# Patient Record
Sex: Male | Born: 1970 | Race: White | Hispanic: No | Marital: Married | Smoking: Current some day smoker
Health system: Southern US, Community
[De-identification: ages and names within clinical notes are randomized; demographics above are authoritative.]

## PROBLEM LIST (undated history)

## (undated) DIAGNOSIS — I10 Essential (primary) hypertension: Secondary | ICD-10-CM

## (undated) HISTORY — DX: Essential (primary) hypertension: I10

---

## 2009-06-13 ENCOUNTER — Encounter: Admission: RE | Admit: 2009-06-13 | Discharge: 2009-06-13 | Payer: Self-pay | Admitting: Occupational Medicine

## 2009-11-15 ENCOUNTER — Encounter: Admission: RE | Admit: 2009-11-15 | Discharge: 2009-11-15 | Payer: Self-pay | Admitting: Occupational Medicine

## 2011-02-07 IMAGING — CR DG CERVICAL SPINE COMPLETE 4+V
6 series · 6 of 6 positions shown · non-contrast
Comparison: None

CLINICAL DATA: Neck and right arm pain

CERVICAL SPINE - COMPLETE 4+ VIEW

[view not recorded (1 of 6)]
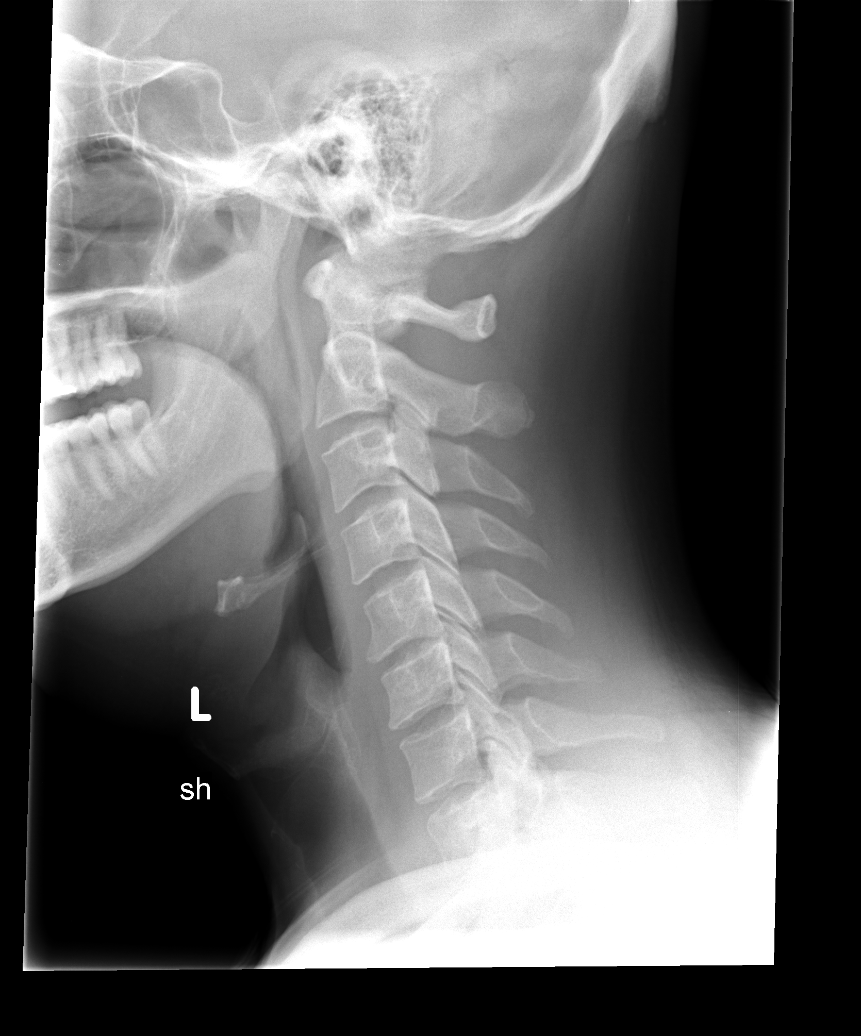

[view not recorded (2 of 6)]
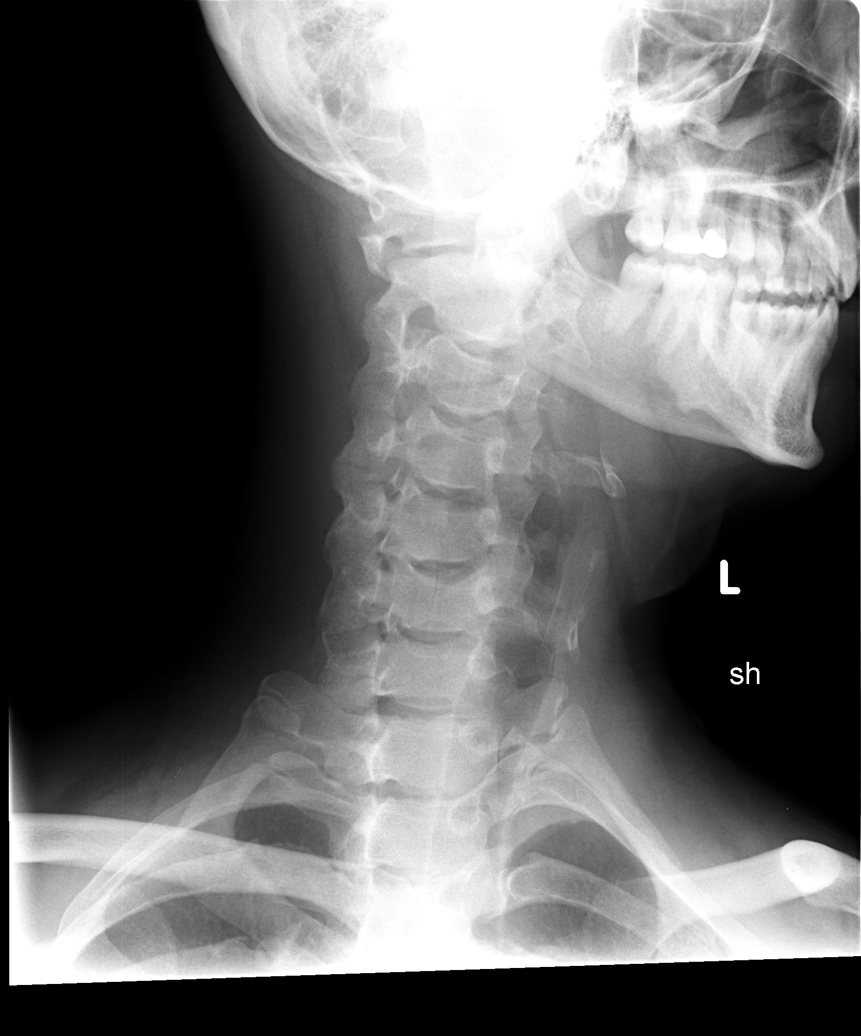

[view not recorded (3 of 6)]
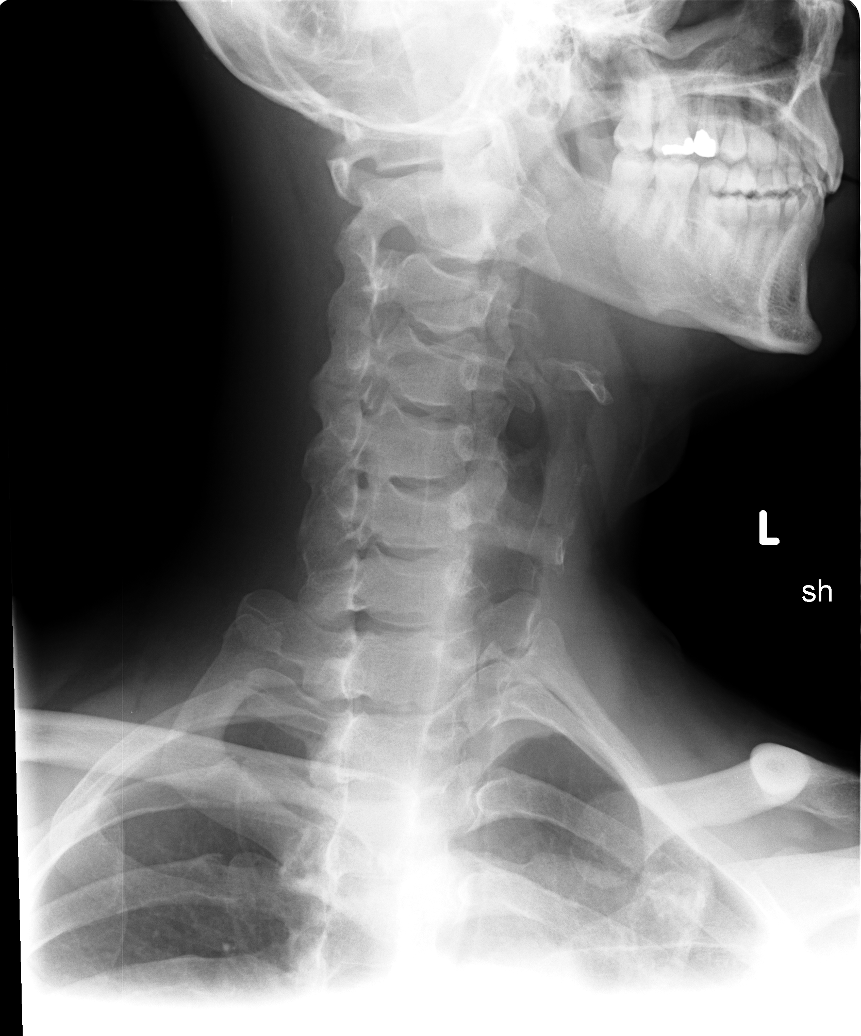

[view not recorded (4 of 6)]
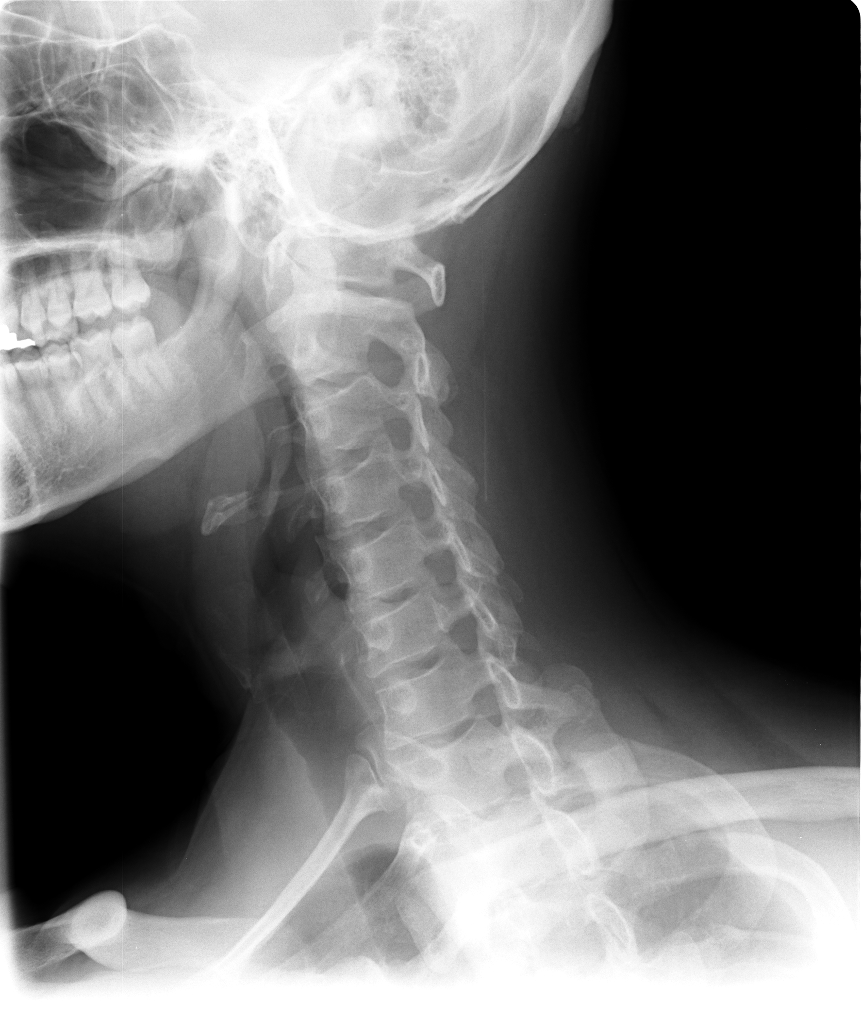

[view not recorded (5 of 6)]
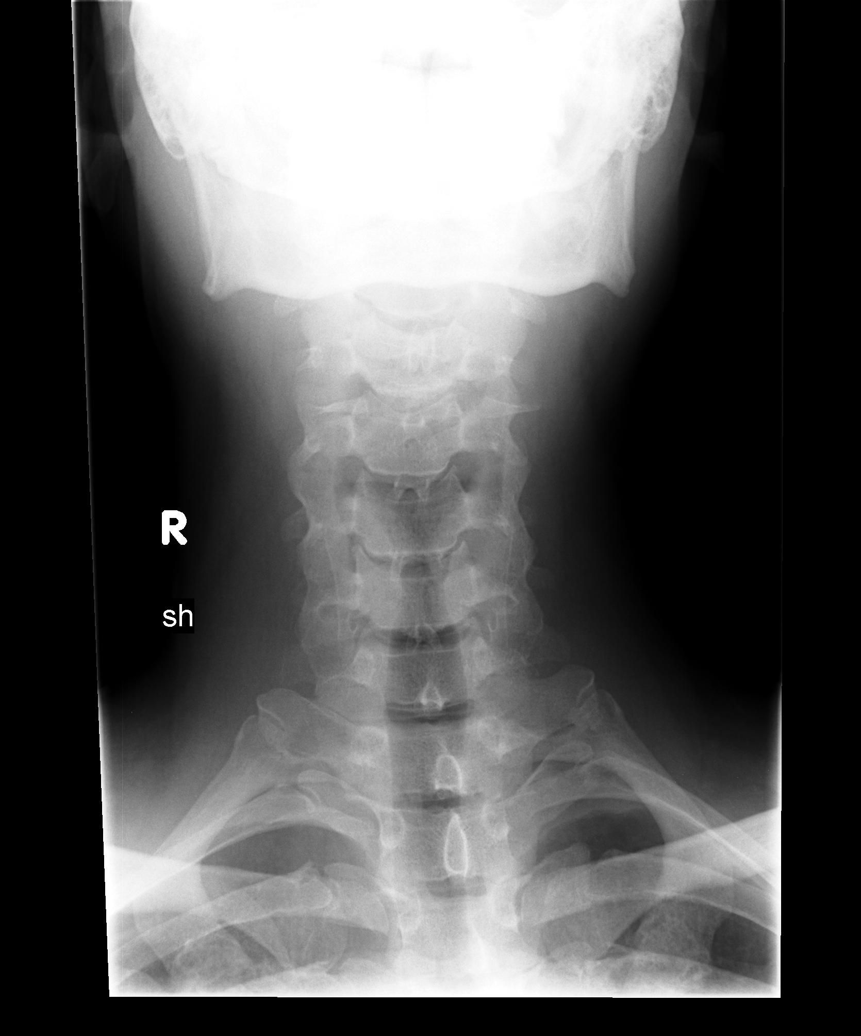

[view not recorded (6 of 6)]
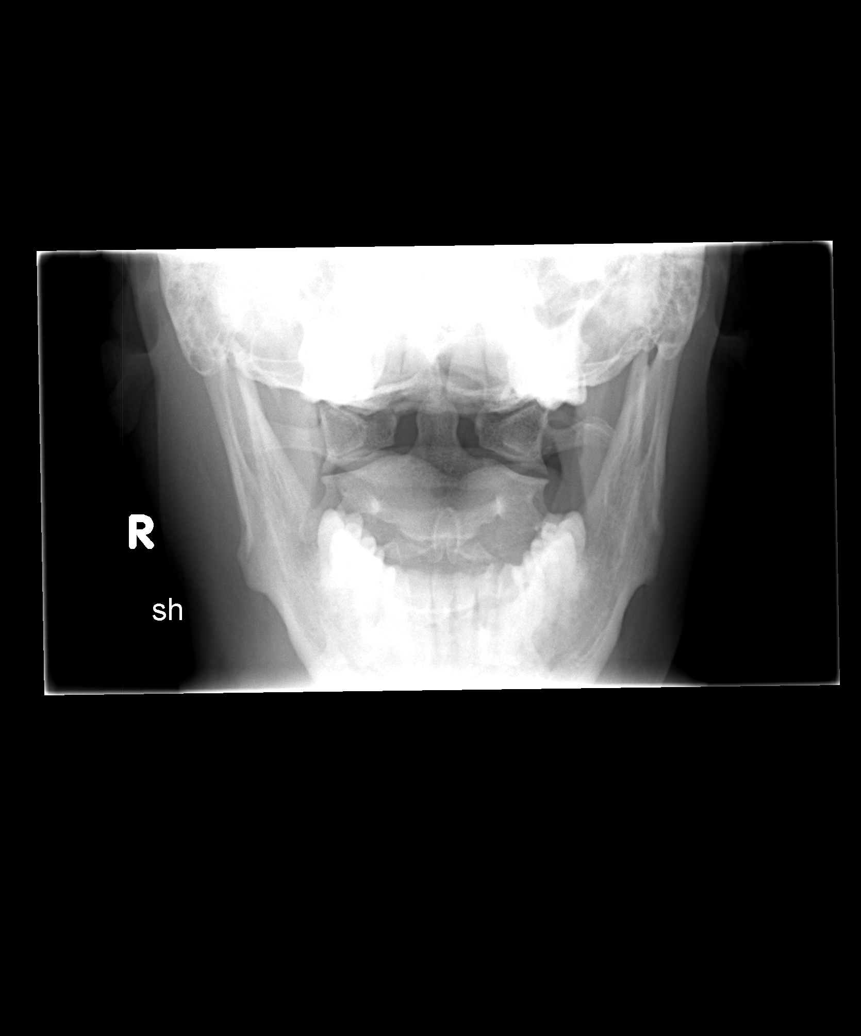

[6 of 6 positions shown; findings below may reference images not displayed]

FINDINGS: The there is loss of the normal cervical lordotic curve.
There is slight disc narrowing at C5-6.  No fracture or
subluxation.  There may be slight narrowing of the right C5-6
neural foramen.  It is possible that the foraminal narrowing is an
artifact since the obliquity is slightly shallow.  We attempted to
further patient to mid back for repeat views, but could not reach
him.  Apparently he has left town.  We would be happy to get him
back for repeat images of the right neural foramina, and no
additional charge.  I would be happy to discuss this with new line
the pertinent clinical data.

If symptoms persist, MRI may be needed to assess for disc
herniation or nerve root impingement.
IMPRESSION: 1.  Mild degenerative disc disease at C5-6 with possible right
foraminal narrowing.  See report.
2.  No acute findings.

## 2012-06-26 ENCOUNTER — Encounter (HOSPITAL_COMMUNITY): Payer: Self-pay

## 2012-06-26 ENCOUNTER — Emergency Department (HOSPITAL_COMMUNITY)
Admission: EM | Admit: 2012-06-26 | Discharge: 2012-06-26 | Disposition: A | Discharge status: Home / Self Care | Payer: Worker's Compensation | Attending: Emergency Medicine | Admitting: Emergency Medicine

## 2012-06-26 DIAGNOSIS — Z7721 Contact with and (suspected) exposure to potentially hazardous body fluids: Secondary | ICD-10-CM | POA: Insufficient documentation

## 2012-06-26 DIAGNOSIS — F17200 Nicotine dependence, unspecified, uncomplicated: Secondary | ICD-10-CM | POA: Insufficient documentation
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
## 2012-06-26 NOTE — ED Provider Notes (Signed)
Medical screening examination/treatment/procedure(s) were performed by non-physician practitioner and as supervising physician I was immediately available for consultation/collaboration.   Gwyneth Sprout, MD 06/26/12 2127
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
## 2021-06-08 ENCOUNTER — Encounter: Payer: Self-pay | Admitting: Emergency Medicine

## 2021-06-08 ENCOUNTER — Ambulatory Visit
Admission: EM | Admit: 2021-06-08 | Discharge: 2021-06-08 | Disposition: A | Discharge status: Home / Self Care | Payer: 59

## 2021-06-08 ENCOUNTER — Other Ambulatory Visit: Payer: Self-pay

## 2021-06-08 DIAGNOSIS — Z1152 Encounter for screening for COVID-19: Secondary | ICD-10-CM

## 2021-06-08 DIAGNOSIS — R051 Acute cough: Secondary | ICD-10-CM | POA: Diagnosis not present

## 2021-06-08 DIAGNOSIS — J069 Acute upper respiratory infection, unspecified: Secondary | ICD-10-CM

## 2021-06-08 MED ORDER — PROMETHAZINE-DM 6.25-15 MG/5ML PO SYRP: 10.0000 mL | ORAL_SOLUTION | Freq: Every evening | ORAL | 0 refills | Status: AC | PRN

## 2021-06-08 MED ORDER — ALBUTEROL SULFATE HFA 108 (90 BASE) MCG/ACT IN AERS: 1.0000 | INHALATION_SPRAY | Freq: Four times a day (QID) | RESPIRATORY_TRACT | 0 refills | Status: AC | PRN

## 2021-06-08 MED ORDER — PREDNISONE 20 MG PO TABS: 40.0000 mg | ORAL_TABLET | Freq: Every day | ORAL | 0 refills | Status: AC

## 2021-06-08 NOTE — ED Triage Notes (Signed)
Patient c/o chest congestion, productive cough and some blurred vision x 3 days.  Patient has been taken Dayquil.  Afebrile.

## 2021-06-08 NOTE — ED Provider Notes (Signed)
[name]    [name]: [id number] Arrival date & time: 06/08/21  0813      History   Chief Complaint Chief Complaint  Patient presents with   Cough    HPI Donald Singleton is a 51 y.o. male.   Subjective:   Donald Singleton is a 51 y.o. male here for evaluation of a cough.  The cough is without wheezing, dyspnea or hemoptysis. Onset of symptoms was 3 days ago and has been unchanged since that time. Associated symptoms include suspected low grade fevers, sputum production, chest tightness, diarrhea and headache.  He denies any chills, body aches, runny nose, congestion, nausea or vomiting.  Patient does not have a history of asthma. Patient has not had recent travel. Patient does not have a history of smoking.  He has tried DayQuil has been some helpful with the cough.  Denies any known COVID exposure.  His grandchildren was sick recently with similar symptoms.  He has been vaccinated against COVID-19 but has not had any boosters.   The following portions of the patient's history were reviewed and updated as appropriate: allergies, current medications, past family history, past medical history, past social history, past surgical history, and problem list.     History reviewed. No pertinent past medical history.  There are no problems to display for this patient.   History reviewed. No pertinent surgical history.     Home Medications    Prior to Admission medications   Medication Sig Start Date End Date Taking? Authorizing Provider  albuterol (VENTOLIN HFA) 108 (90 Base) MCG/ACT inhaler Inhale 1-2 puffs into the lungs every 6 (six) hours as needed for wheezing or shortness of breath (bronchospasms). 06/08/21  Yes Lurline Idol, FNP  buPROPion (WELLBUTRIN XL) 300 MG 24 hr tablet Take 300 mg by mouth every morning. 05/24/21  Yes [provider]  levothyroxine (SYNTHROID) 25 MCG tablet Take 25 mcg by mouth daily. 05/31/21  Yes [provider]  lisinopril-hydrochlorothiazide (ZESTORETIC) 20-25 MG tablet Take 1 tablet by mouth daily. 05/31/21  Yes [provider]  predniSONE (DELTASONE) 20 MG tablet Take 2 tablets (40 mg total) by mouth daily for 5 days. 06/08/21 06/13/21 Yes Lurline Idol, FNP  promethazine-dextromethorphan (PROMETHAZINE-DM) 6.25-15 MG/5ML syrup Take 10 mLs by mouth at bedtime as needed for cough (may take every 6 hours as needed when not working). 06/08/21  Yes Lurline Idol, FNP    Family History Family History  Problem Relation Age of Onset   Healthy Sister    Healthy Brother     Social History Social History   Tobacco Use   Smoking status: Some Days    Types: Cigars  Substance Use Topics   Alcohol use: Yes    Comment: social   Drug use: No     Allergies   Patient has no known allergies.   Review of Systems Review of Systems  Constitutional:  Positive for fever. Negative for chills and fatigue.  HENT:  Positive for rhinorrhea. Negative for congestion, sneezing and sore throat.   Respiratory:  Positive for cough. Negative for shortness of breath and wheezing.   Cardiovascular:  Negative for chest pain and palpitations.  Gastrointestinal:  Positive for diarrhea. Negative for nausea and vomiting.  Musculoskeletal:  Negative for myalgias.  Neurological:  Positive for headaches.  All other systems reviewed and are negative.   Physical Exam Triage Vital Signs ED Triage Vitals  Enc Vitals Group     BP 06/08/21 0930 (!) 137/95  Pulse Rate 06/08/21 0930 96     Resp --      Temp 06/08/21 0930 98.7 F (37.1 C)     Temp Source 06/08/21 0930 Oral     SpO2 06/08/21 0930 96 %     Weight 06/08/21 0931 245 lb (111.1 kg)     Height 06/08/21 0931 6\' 2"  (1.88 m)     Head Circumference --      Peak Flow --      Pain Score 06/08/21 0930 4     Pain Loc --      Pain Edu? --      Excl. in GC? --    No data found.  Updated Vital Signs BP (!) 137/95 (BP Location: Left Arm)     Pulse 96    Temp 98.7 F (37.1 C) (Oral)    Ht 6\' 2"  (1.88 m)    Wt 245 lb (111.1 kg)    SpO2 96%    BMI 31.46 kg/m   Visual Acuity Right Eye Distance:   Left Eye Distance:   Bilateral Distance:    Right Eye Near:   Left Eye Near:    Bilateral Near:     Physical Exam Vitals and nursing note reviewed.  Constitutional:      General: He is not in acute distress.    Appearance: Normal appearance. He is normal weight. He is not ill-appearing or toxic-appearing.  HENT:     Head: Normocephalic.     Nose: Nose normal.     Mouth/Throat:     Mouth: Mucous membranes are moist.  Eyes:     Conjunctiva/sclera: Conjunctivae normal.  Cardiovascular:     Rate and Rhythm: Normal rate and regular rhythm.  Pulmonary:     Effort: Pulmonary effort is normal.     Breath sounds: Normal breath sounds.  Abdominal:     Palpations: Abdomen is soft.  Musculoskeletal:        General: Normal range of motion.     Cervical back: Normal range of motion and neck supple.  Lymphadenopathy:     Cervical: No cervical adenopathy.  Skin:    General: Skin is warm and dry.  Neurological:     General: No focal deficit present.     Mental Status: He is alert and oriented to person, place, and time.     Cranial Nerves: No cranial nerve deficit.     Sensory: No sensory deficit.     Motor: No weakness.     Coordination: Coordination normal.     Gait: Gait normal.  Psychiatric:        Mood and Affect: Mood normal.        Behavior: Behavior normal.     UC Treatments / Results  Labs (all labs ordered are listed, but only abnormal results are displayed) Labs Reviewed  NOVEL CORONAVIRUS, NAA    EKG   Radiology No results found.  Procedures Procedures (including critical care time)  Medications Ordered in UC Medications - No data to display  Initial Impression / Assessment and Plan / UC Course  I have reviewed the triage vital signs and the nursing notes.  Pertinent labs & imaging results that  were available during my care of the patient were reviewed by me and considered in my medical decision making (see chart for details).    51 year old male with a 3-day history of cough, suspected low-grade fevers, sputum production, chest tightness, diarrhea and headache.  Patient is afebrile.  Nontoxic.  Physical exam unremarkable.  COVID test pending.  Take medications as prescribed for supportive care and drink plenty of fluids.    Today's evaluation has revealed no signs of a dangerous process. Discussed diagnosis with patient and/or guardian. Patient and/or guardian aware of their diagnosis, possible red flag symptoms to watch out for and need for close follow up. Patient and/or guardian understands verbal and written discharge instructions. Patient and/or guardian comfortable with plan and disposition.  Patient and/or guardian has a clear mental status at this time, good insight into illness (after discussion and teaching) and has clear judgment to make decisions regarding their care  This care was provided during an unprecedented National Emergency due to the Novel Coronavirus (COVID-19) pandemic. COVID-19 infections and transmission risks place heavy strains on healthcare resources.  As this pandemic evolves, our facility, providers, and staff strive to respond fluidly, to remain operational, and to provide care relative to available resources and information. Outcomes are unpredictable and treatments are without well-defined guidelines. Further, the impact of COVID-19 on all aspects of urgent care, including the impact to patients seeking care for reasons other than COVID-19, is unavoidable during this national emergency. At this time of the global pandemic, management of patients has significantly changed, even for non-COVID positive patients given high local and regional COVID volumes at this time requiring high healthcare system and resource utilization. The standard of care for management of both  COVID suspected and non-COVID suspected patients continues to change rapidly at the local, regional, national, and global levels. This patient was worked up and treated to the best available but ever changing evidence and resources available at this current time.   Documentation was completed with the aid of voice recognition software. Transcription may contain typographical errors. Final Clinical Impressions(s) / UC Diagnoses   Final diagnoses:  Acute cough  Viral URI  Encounter for screening for COVID-19     Discharge Instructions      Take medications as prescribed. You may take tylenol or ibuprofen as needed for fevers/headache/body aches. Drink plenty of fluids. Stay in home isolation until you receive results of your COVID test. You will only be notified for positive results. You may go online to MyChart and review your results. Go to the ED immediately if you get worse or have any other symptoms.       ED Prescriptions     Medication Sig Dispense Auth. Provider   albuterol (VENTOLIN HFA) 108 (90 Base) MCG/ACT inhaler Inhale 1-2 puffs into the lungs every 6 (six) hours as needed for wheezing or shortness of breath (bronchospasms). 18 g Lurline Idol, FNP   promethazine-dextromethorphan (PROMETHAZINE-DM) 6.25-15 MG/5ML syrup Take 10 mLs by mouth at bedtime as needed for cough (may take every 6 hours as needed when not working). 118 mL Lurline Idol, FNP   predniSONE (DELTASONE) 20 MG tablet Take 2 tablets (40 mg total) by mouth daily for 5 days. 10 tablet Lurline Idol, FNP      PDMP not reviewed this encounter.   Cathlean Sauer [place], [place] 06/08/21 [phone]

## 2021-06-08 NOTE — Discharge Instructions (Addendum)
Take medications as prescribed. You may take tylenol or ibuprofen as needed for fevers/headache/body aches. Drink plenty of fluids. Stay in home isolation until you receive results of your COVID test. You will only be notified for positive results. You may go online to MyChart and review your results. Go to the ED immediately if you get worse or have any other symptoms.   

## 2021-06-09 LAB — NOVEL CORONAVIRUS, NAA: SARS-CoV-2, NAA: NOT DETECTED

## 2021-06-09 LAB — SARS-COV-2, NAA 2 DAY TAT

## 2023-06-29 ENCOUNTER — Emergency Department (HOSPITAL_BASED_OUTPATIENT_CLINIC_OR_DEPARTMENT_OTHER)
Admission: EM | Admit: 2023-06-29 | Discharge: 2023-06-29 | Disposition: A | Discharge status: Home / Self Care | Payer: Worker's Compensation | Attending: Emergency Medicine | Admitting: Emergency Medicine

## 2023-06-29 ENCOUNTER — Encounter (HOSPITAL_BASED_OUTPATIENT_CLINIC_OR_DEPARTMENT_OTHER): Payer: Self-pay

## 2023-06-29 DIAGNOSIS — I10 Essential (primary) hypertension: Secondary | ICD-10-CM | POA: Diagnosis not present

## 2023-06-29 DIAGNOSIS — Z79899 Other long term (current) drug therapy: Secondary | ICD-10-CM | POA: Insufficient documentation

## 2023-06-29 DIAGNOSIS — R110 Nausea: Secondary | ICD-10-CM | POA: Insufficient documentation

## 2023-06-29 DIAGNOSIS — R42 Dizziness and giddiness: Secondary | ICD-10-CM | POA: Insufficient documentation

## 2023-06-29 DIAGNOSIS — H538 Other visual disturbances: Secondary | ICD-10-CM | POA: Insufficient documentation

## 2023-06-29 DIAGNOSIS — R519 Headache, unspecified: Secondary | ICD-10-CM | POA: Diagnosis not present

## 2023-06-29 DIAGNOSIS — J029 Acute pharyngitis, unspecified: Secondary | ICD-10-CM | POA: Diagnosis not present

## 2023-06-29 LAB — CBC
HCT: 49.9 % (ref 39.0–52.0)
Hemoglobin: 17.3 g/dL — ABNORMAL HIGH (ref 13.0–17.0)
MCH: 30 pg (ref 26.0–34.0)
MCHC: 34.7 g/dL (ref 30.0–36.0)
MCV: 86.6 fL (ref 80.0–100.0)
Platelets: 253 10*3/uL (ref 150–400)
RBC: 5.76 MIL/uL (ref 4.22–5.81)
RDW: 12.1 % (ref 11.5–15.5)
WBC: 7.3 10*3/uL (ref 4.0–10.5)
nRBC: 0 % (ref 0.0–0.2)

## 2023-06-29 LAB — BASIC METABOLIC PANEL
Anion gap: 9 (ref 5–15)
BUN: 17 mg/dL (ref 6–20)
CO2: 28 mmol/L (ref 22–32)
Calcium: 9.5 mg/dL (ref 8.9–10.3)
Chloride: 101 mmol/L (ref 98–111)
Creatinine, Ser: 0.83 mg/dL (ref 0.61–1.24)
GFR, Estimated: >60 mL/min (ref >60)
Glucose, Bld: 106 mg/dL — ABNORMAL HIGH (ref 70–99)
Potassium: 3.6 mmol/L (ref 3.5–5.1)
Sodium: 138 mmol/L (ref 135–145)

## 2023-06-29 LAB — TROPONIN I (HIGH SENSITIVITY): Troponin I (High Sensitivity): 4 ng/L (ref <18)

## 2023-06-29 LAB — RESP PANEL BY RT-PCR (RSV, FLU A&B, COVID)  RVPGX2
Influenza A by PCR: NEGATIVE
Influenza B by PCR: NEGATIVE
Resp Syncytial Virus by PCR: NEGATIVE
SARS Coronavirus 2 by RT PCR: NEGATIVE

## 2023-06-29 LAB — MAGNESIUM: Magnesium: 2.3 mg/dL (ref 1.7–2.4)

## 2023-06-29 MED ORDER — KETOROLAC TROMETHAMINE 30 MG/ML IJ SOLN
30.0000 mg | Freq: Once | INTRAMUSCULAR | Status: AC
  Administered 2023-06-29: 30 mg via INTRAVENOUS
  Filled 2023-06-29: qty 1

## 2023-06-29 MED ORDER — LISINOPRIL 10 MG PO TABS
10.0000 mg | ORAL_TABLET | Freq: Once | ORAL | Status: AC
  Administered 2023-06-29: 10 mg via ORAL
  Filled 2023-06-29: qty 1
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
## 2023-06-29 NOTE — ED Triage Notes (Signed)
 Our R.T. Donald Singleton has just checked pt. With our hand-held Oxymeter/CO quantifier and found his CO level to be 1.0.
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
## 2024-05-18 ENCOUNTER — Encounter: Payer: Self-pay | Admitting: *Deleted

## 2024-05-18 ENCOUNTER — Ambulatory Visit: Admission: EM | Admit: 2024-05-18 | Discharge: 2024-05-18 | Disposition: A | Discharge status: Home / Self Care

## 2024-05-18 DIAGNOSIS — S61402A Unspecified open wound of left hand, initial encounter: Secondary | ICD-10-CM | POA: Insufficient documentation

## 2024-05-18 DIAGNOSIS — X58XXXA Exposure to other specified factors, initial encounter: Secondary | ICD-10-CM | POA: Insufficient documentation

## 2024-05-18 MED ORDER — DOXYCYCLINE HYCLATE 100 MG PO CAPS: 100.0000 mg | ORAL_CAPSULE | Freq: Two times a day (BID) | ORAL | 0 refills | Status: AC

## 2024-05-18 MED ORDER — CEFTRIAXONE SODIUM 1 G IJ SOLR
1.0000 g | Freq: Once | INTRAMUSCULAR | Status: AC
  Administered 2024-05-18: 1 g via INTRAMUSCULAR

## 2024-05-18 MED ORDER — TETANUS-DIPHTH-ACELL PERTUSSIS 5-2-15.5 LF-MCG/0.5 IM SUSP
0.5000 mL | Freq: Once | INTRAMUSCULAR | Status: AC
  Administered 2024-05-18: 0.5 mL via INTRAMUSCULAR

## 2024-05-18 NOTE — Discharge Instructions (Addendum)
 If you wound does not seem to be improving in 24-48 hrs we need to follow-up in clinic or report to the ED. If you experience body aches, chills, nausea, fever (100.5 or above), etc. We should report to the ED immediately.

## 2024-05-18 NOTE — ED Triage Notes (Signed)
 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 Today his hand is painful a swollen with dark red blister around wound

## 2024-05-18 NOTE — ED Provider Notes (Signed)
 " [name]    [name]: [id number] Arrival date & time: 05/18/24  0804      History   Chief Complaint Chief Complaint  Patient presents with   Wound Check    HPI Donald Singleton is a 54 y.o. male.   Pt presents today due to infected cut of ventral aspect of left thumb. Pt states that he accidentally cut himself while cleaning deer hide 2 days ago. Pt states that initially that cut appeared superficial but overnight it turned into was appears to be a blood blister with pain that radiates up his arm and stiffness with left hand movement. Pt has something similar in the past and he became septic and was hospitalized. Pt states that he is trying to get ahead of it. Pt denies fever, chills, fatigue, body aches, nausea, or vomiting at this time.   The history is provided by the patient.  Wound Check    Past Medical History:  Diagnosis Date   Hypertension     There are no active problems to display for this patient.   No past surgical history on file.     Home Medications    Prior to Admission medications  Medication Sig Start Date End Date Taking? Authorizing Provider  doxycycline  (VIBRAMYCIN ) 100 MG capsule Take 1 capsule (100 mg total) by mouth 2 (two) times daily. 05/18/24  Yes Andra Krabbe C, PA-C  escitalopram (LEXAPRO) 10 MG tablet Take 10 mg by mouth daily. 12/10/23  Yes [provider]  tiZANidine (ZANAFLEX) 4 MG tablet Take 4 mg by mouth 2 (two) times daily as needed. 04/11/24  Yes [provider]  albuterol  (VENTOLIN  HFA) 108 (90 Base) MCG/ACT inhaler Inhale 1-2 puffs into the lungs every 6 (six) hours as needed for wheezing or shortness of breath (bronchospasms). 06/08/21   Murrill, Samantha, FNP  buPROPion (WELLBUTRIN XL) 150 MG 24 hr tablet Take 150 mg by mouth daily.    [provider]  buPROPion (WELLBUTRIN XL) 300 MG 24 hr tablet Take 300 mg by mouth every morning. 05/24/21   [provider]  levothyroxine  (SYNTHROID) 25 MCG tablet Take 25 mcg by mouth daily. 05/31/21   [provider]  lisinopril -hydrochlorothiazide (ZESTORETIC) 10-12.5 MG tablet Take 1 tablet by mouth daily.    [provider]  lisinopril -hydrochlorothiazide (ZESTORETIC) 20-25 MG tablet Take 1 tablet by mouth daily. 05/31/21   [provider]  promethazine -dextromethorphan (PROMETHAZINE -DM) 6.25-15 MG/5ML syrup Take 10 mLs by mouth at bedtime as needed for cough (may take every 6 hours as needed when not working). 06/08/21   Iola Lukes, FNP    Family History Family History  Problem Relation Age of Onset   Healthy Sister    Healthy Brother     Social History Social History[1]   Allergies   Simvastatin   Review of Systems Review of Systems   Physical Exam Triage Vital Signs ED Triage Vitals  Encounter Vitals Group     BP 05/18/24 0852 (!) 155/88     Girls Systolic BP Percentile --      Girls Diastolic BP Percentile --      Boys Systolic BP Percentile --      Boys Diastolic BP Percentile --      Pulse Rate 05/18/24 0852 93     Resp 05/18/24 0852 18     Temp 05/18/24 0852 99.2 F (37.3 C)     Temp Source 05/18/24 0852 Oral     SpO2 05/18/24 0852 95 %  Weight --      Height --      Head Circumference --      Peak Flow --      Pain Score 05/18/24 0847 2     Pain Loc --      Pain Education --      Exclude from Growth Chart --    No data found.  Updated Vital Signs BP (!) 155/88 (BP Location: Right Arm)   Pulse 93   Temp 99.2 F (37.3 C) (Oral)   Resp 18   SpO2 95%   Visual Acuity Right Eye Distance:   Left Eye Distance:   Bilateral Distance:    Right Eye Near:   Left Eye Near:    Bilateral Near:     Physical Exam Vitals and nursing note reviewed.  Constitutional:      General: He is not in acute distress.    Appearance: Normal appearance. He is not ill-appearing, toxic-appearing or diaphoretic.  Eyes:     General: No scleral icterus. Cardiovascular:      Rate and Rhythm: Normal rate and regular rhythm.     Heart sounds: Normal heart sounds.  Pulmonary:     Effort: Pulmonary effort is normal. No respiratory distress.     Breath sounds: Normal breath sounds. No wheezing or rhonchi.  Musculoskeletal:     Left hand: Laceration and tenderness present. No swelling or deformity.  Skin:    General: Skin is warm.  Neurological:     Mental Status: He is alert and oriented to person, place, and time.  Psychiatric:        Mood and Affect: Mood normal.        Behavior: Behavior normal.      UC Treatments / Results  Labs (all labs ordered are listed, but only abnormal results are displayed) Labs Reviewed  AEROBIC CULTURE W GRAM STAIN (SUPERFICIAL SPECIMEN)    EKG   Radiology No results found.  Procedures Procedures (including critical care time)  Medications Ordered in UC Medications  cefTRIAXone  (ROCEPHIN ) injection 1 g (1 g Intramuscular Given 05/18/24 0930)  Tdap (ADACEL) injection 0.5 mL (0.5 mLs Intramuscular Given 05/18/24 0931)    Initial Impression / Assessment and Plan / UC Course  I have reviewed the triage vital signs and the nursing notes.  Pertinent labs & imaging results that were available during my care of the patient were reviewed by me and considered in my medical decision making (see chart for details).    Final Clinical Impressions(s) / UC Diagnoses   Final diagnoses:  Open wound of left hand, foreign body presence unspecified, unspecified wound type, initial encounter     Discharge Instructions      If you wound does not seem to be improving in 24-48 hrs we need to follow-up in clinic or report to the ED. If you experience body aches, chills, nausea, fever (100.5 or above), etc. We should report to the ED immediately.     ED Prescriptions     Medication Sig Dispense Auth. Provider   doxycycline  (VIBRAMYCIN ) 100 MG capsule Take 1 capsule (100 mg total) by mouth 2 (two) times daily. 20 capsule  Andra Corean BROCKS, PA-C      PDMP not reviewed this encounter.    [1]  Social History Tobacco Use   Smoking status: Some Days    Types: Cigars   Smokeless tobacco: Current    Types: Snuff  Vaping Use   Vaping status: Never Used  Substance  Use Topics   Alcohol use: Yes    Comment: social   Drug use: No     Nuri, Larmer, PA-C 05/18/24 9063  "

## 2024-05-20 LAB — AEROBIC CULTURE W GRAM STAIN (SUPERFICIAL SPECIMEN)
Culture: NO GROWTH
Gram Stain: NONE SEEN
